# Patient Record
Sex: Female | Born: 1995 | Race: White | Hispanic: No | Marital: Married | Smoking: Never smoker
Health system: Southern US, Community
[De-identification: ages and names within clinical notes are randomized; demographics above are authoritative.]

---

## 2020-04-15 ENCOUNTER — Emergency Department (HOSPITAL_COMMUNITY)
Admission: EM | Admit: 2020-04-15 | Discharge: 2020-04-15 | Disposition: A | Discharge status: Home / Self Care | Payer: BC Managed Care – PPO | Attending: Emergency Medicine | Admitting: Emergency Medicine

## 2020-04-15 ENCOUNTER — Emergency Department (HOSPITAL_COMMUNITY): Payer: BC Managed Care – PPO

## 2020-04-15 ENCOUNTER — Other Ambulatory Visit: Payer: Self-pay

## 2020-04-15 ENCOUNTER — Encounter (HOSPITAL_COMMUNITY): Payer: Self-pay

## 2020-04-15 DIAGNOSIS — R109 Unspecified abdominal pain: Secondary | ICD-10-CM

## 2020-04-15 DIAGNOSIS — R112 Nausea with vomiting, unspecified: Secondary | ICD-10-CM | POA: Diagnosis not present

## 2020-04-15 DIAGNOSIS — R1030 Lower abdominal pain, unspecified: Secondary | ICD-10-CM | POA: Diagnosis not present

## 2020-04-15 LAB — COMPREHENSIVE METABOLIC PANEL
ALT: 15 U/L (ref 0–44)
AST: 18 U/L (ref 15–41)
Albumin: 4.4 g/dL (ref 3.5–5.0)
Alkaline Phosphatase: 45 U/L (ref 38–126)
Anion gap: 8 (ref 5–15)
BUN: 7 mg/dL (ref 6–20)
CO2: 27 mmol/L (ref 22–32)
Calcium: 9.7 mg/dL (ref 8.9–10.3)
Chloride: 106 mmol/L (ref 98–111)
Creatinine, Ser: 0.81 mg/dL (ref 0.44–1.00)
GFR, Estimated: >60 mL/min (ref >60)
Glucose, Bld: 95 mg/dL (ref 70–99)
Potassium: 3.7 mmol/L (ref 3.5–5.1)
Sodium: 141 mmol/L (ref 135–145)
Total Bilirubin: 0.6 mg/dL (ref 0.3–1.2)
Total Protein: 7.7 g/dL (ref 6.5–8.1)

## 2020-04-15 LAB — CBC WITH DIFFERENTIAL/PLATELET
Abs Immature Granulocytes: 0.01 10*3/uL (ref 0.00–0.07)
Basophils Absolute: 0 10*3/uL (ref 0.0–0.1)
Basophils Relative: 1 %
Eosinophils Absolute: 0.1 10*3/uL (ref 0.0–0.5)
Eosinophils Relative: 1 %
HCT: 42.2 % (ref 36.0–46.0)
Hemoglobin: 14.1 g/dL (ref 12.0–15.0)
Immature Granulocytes: 0 %
Lymphocytes Relative: 34 %
Lymphs Abs: 1.8 10*3/uL (ref 0.7–4.0)
MCH: 32.1 pg (ref 26.0–34.0)
MCHC: 33.4 g/dL (ref 30.0–36.0)
MCV: 96.1 fL (ref 80.0–100.0)
Monocytes Absolute: 0.5 10*3/uL (ref 0.1–1.0)
Monocytes Relative: 9 %
Neutro Abs: 2.9 10*3/uL (ref 1.7–7.7)
Neutrophils Relative %: 55 %
Platelets: 259 10*3/uL (ref 150–400)
RBC: 4.39 MIL/uL (ref 3.87–5.11)
RDW: 11.9 % (ref 11.5–15.5)
WBC: 5.3 10*3/uL (ref 4.0–10.5)
nRBC: 0 % (ref 0.0–0.2)

## 2020-04-15 LAB — URINALYSIS, ROUTINE W REFLEX MICROSCOPIC
Bilirubin Urine: NEGATIVE
Glucose, UA: NEGATIVE mg/dL
Hgb urine dipstick: NEGATIVE
Ketones, ur: NEGATIVE mg/dL
Nitrite: NEGATIVE
Protein, ur: NEGATIVE mg/dL
Specific Gravity, Urine: 1.012 (ref 1.005–1.030)
pH: 6 (ref 5.0–8.0)

## 2020-04-15 LAB — I-STAT BETA HCG BLOOD, ED (MC, WL, AP ONLY): I-stat hCG, quantitative: <5 m[IU]/mL (ref <5)

## 2020-04-15 LAB — LIPASE, BLOOD: Lipase: 35 U/L (ref 11–51)

## 2020-04-15 MED ORDER — HYDROCODONE-ACETAMINOPHEN 5-325 MG PO TABS: 1.0000 | ORAL_TABLET | ORAL | 0 refills | Status: AC | PRN

## 2020-04-15 MED ORDER — SODIUM CHLORIDE 0.9 % IV SOLN: INTRAVENOUS | Status: DC

## 2020-04-15 MED ORDER — METOCLOPRAMIDE HCL 5 MG/ML IJ SOLN
5.0000 mg | Freq: Once | INTRAMUSCULAR | Status: AC
  Administered 2020-04-15: 5 mg via INTRAVENOUS
  Filled 2020-04-15: qty 2

## 2020-04-15 MED ORDER — SODIUM CHLORIDE 0.9 % IV BOLUS
2000.0000 mL | Freq: Once | INTRAVENOUS | Status: AC
  Administered 2020-04-15: 2000 mL via INTRAVENOUS

## 2020-04-15 MED ORDER — MORPHINE SULFATE (PF) 4 MG/ML IV SOLN
4.0000 mg | Freq: Once | INTRAVENOUS | Status: AC
Start: 2020-04-15 — End: 2020-04-15
  Administered 2020-04-15: 4 mg via INTRAVENOUS
  Filled 2020-04-15: qty 1

## 2020-04-15 MED ORDER — IOHEXOL 300 MG/ML  SOLN
100.0000 mL | Freq: Once | INTRAMUSCULAR | Status: AC | PRN
  Administered 2020-04-15: 100 mL via INTRAVENOUS

## 2020-04-15 NOTE — ED Provider Notes (Signed)
[facility]-EMERGENCY DEPT Provider Note   CSN: [id number] Arrival date & time: 04/15/20  0531     History Chief Complaint  Patient presents with  . Abdominal Pain    Chau Savell is a 25 y.o. female.  25 year old female presents with lower abdominal discomfort x5 days.  Pain seems to wax and wanes and nothing makes it better.  Has had associated nausea and vomiting.  States that symptoms are worse when she eats.  Denies any fever or chills.  No urinary symptoms.  No vaginal bleeding or discharge.  States that she does not have regular menstrual cycles due to her birth control.  Has taken multiple home pregnancy tests that have been negative.  Denies any URI symptoms.  No prior history of same.        History reviewed. No pertinent past medical history.  There are no problems to display for this patient.   History reviewed. No pertinent surgical history.   OB History   No obstetric history on file.     No family history on file.  Social History   Tobacco Use  . Smoking status: Never Smoker  . Smokeless tobacco: Never Used  Substance Use Topics  . Alcohol use: Not Currently  . Drug use: Never    Home Medications Prior to Admission medications   Not on File    Allergies    Patient has no known allergies.  Review of Systems   Review of Systems  All other systems reviewed and are negative.   Physical Exam Updated Vital Signs BP 111/73 (BP Location: Left Arm)   Pulse 71   Temp 98.2 F (36.8 C) (Oral)   Resp 18   SpO2 100%   Physical Exam Vitals and nursing note reviewed.  Constitutional:      General: She is not in acute distress.    Appearance: Normal appearance. She is well-developed and well-nourished. She is not toxic-appearing.  HENT:     Head: Normocephalic and atraumatic.  Eyes:     General: Lids are normal.     Extraocular Movements: EOM normal.     Conjunctiva/sclera: Conjunctivae normal.     Pupils: Pupils are  equal, round, and reactive to light.  Neck:     Thyroid: No thyroid mass.     Trachea: No tracheal deviation.  Cardiovascular:     Rate and Rhythm: Normal rate and regular rhythm.     Heart sounds: Normal heart sounds. No murmur heard. No gallop.   Pulmonary:     Effort: Pulmonary effort is normal. No respiratory distress.     Breath sounds: Normal breath sounds. No stridor. No decreased breath sounds, wheezing, rhonchi or rales.  Abdominal:     General: Bowel sounds are normal. There is no distension.     Palpations: Abdomen is soft.     Tenderness: There is no abdominal tenderness. There is no CVA tenderness or rebound.    Musculoskeletal:        General: No tenderness or edema. Normal range of motion.     Cervical back: Normal range of motion and neck supple.  Skin:    General: Skin is warm and dry.     Findings: No abrasion or rash.  Neurological:     Mental Status: She is alert and oriented to person, place, and time.     GCS: GCS eye subscore is 4. GCS verbal subscore is 5. GCS motor subscore is 6.     Cranial Nerves:  No cranial nerve deficit.     Sensory: No sensory deficit.     Deep Tendon Reflexes: Strength normal.  Psychiatric:        Mood and Affect: Mood and affect normal.        Speech: Speech normal.        Behavior: Behavior normal.     ED Results / Procedures / Treatments   Labs (all labs ordered are listed, but only abnormal results are displayed) Labs Reviewed  CBC WITH DIFFERENTIAL/PLATELET  COMPREHENSIVE METABOLIC PANEL  LIPASE, BLOOD  URINALYSIS, ROUTINE W REFLEX MICROSCOPIC  I-STAT BETA HCG BLOOD, ED ([facility], [facility], [facility] ONLY)    EKG None  Radiology No results found.  Procedures Procedures (including critical care time)  Medications Ordered in ED Medications  0.9 %  sodium chloride infusion (has no administration in time range)  sodium chloride 0.9 % bolus 2,000 mL (has no administration in time range)  metoCLOPramide (REGLAN) injection 5 mg  (has no administration in time range)  morphine 4 MG/ML injection 4 mg (has no administration in time range)    ED Course  I have reviewed the triage vital signs and the nursing notes.  Pertinent labs & imaging results that were available during my care of the patient were reviewed by me and considered in my medical decision making (see chart for details).    MDM Rules/Calculators/A&P                          Treated with medications here and does feel better.  CT scan did not show any acute abnormalities.  Labs and urine are reassuring.  Will discharge home with return precautions Final Clinical Impression(s) / ED Diagnoses Final diagnoses:  None    Rx / DC Orders ED Discharge Orders    None       Lorre Nick, MD 04/15/20 1102

## 2020-04-15 NOTE — ED Triage Notes (Signed)
Pt reports lower abdominal pain, nausea and vomiting x 5 days.

## 2021-06-11 IMAGING — CT CT ABD-PELV W/ CM
2 of 4 series · 16 of 46 positions shown, 18 images · IV contrast (omnipaque)
Comparison: None.

CLINICAL DATA: Acute lower abdominal pain.

EXAM:
CT ABDOMEN AND PELVIS WITH CONTRAST
TECHNIQUE: Multidetector CT imaging of the abdomen and pelvis was performed
using the standard protocol following bolus administration of
intravenous contrast.
CONTRAST:  100mL OMNIPAQUE IOHEXOL 300 MG/ML  SOLN

[Series 2: axial st · axial · 0.78mm/px · z∈[+1206,+1576]mm · 13 of 84 slices shown, 15 images]
[im 5/84  soft-tissue]
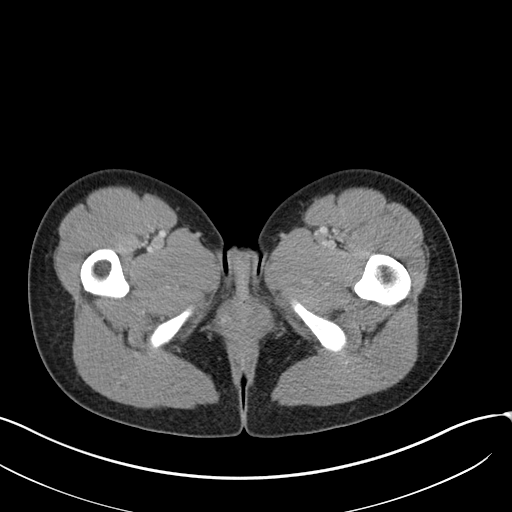
[im 5/84  bone]
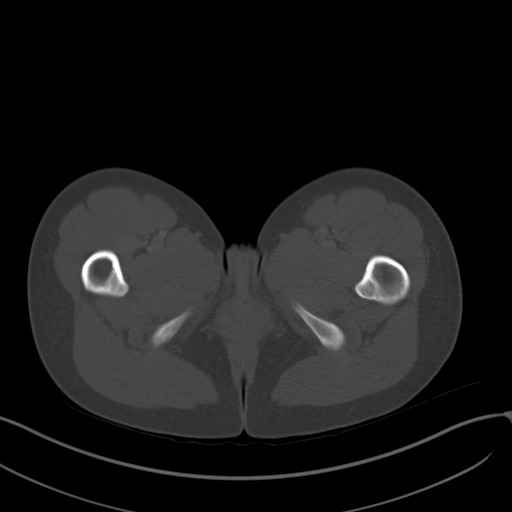
[im 10/84  soft-tissue]
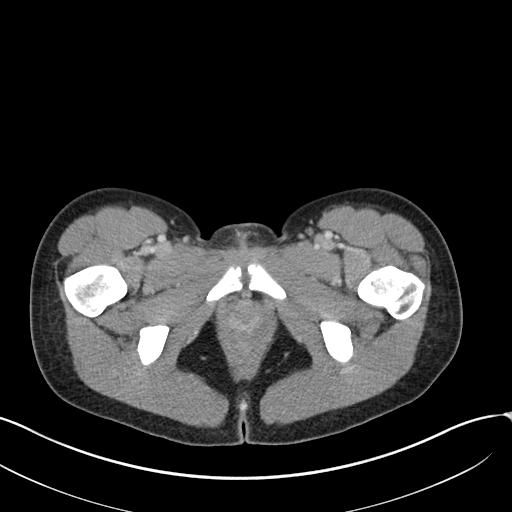
[im 19/84  soft-tissue]
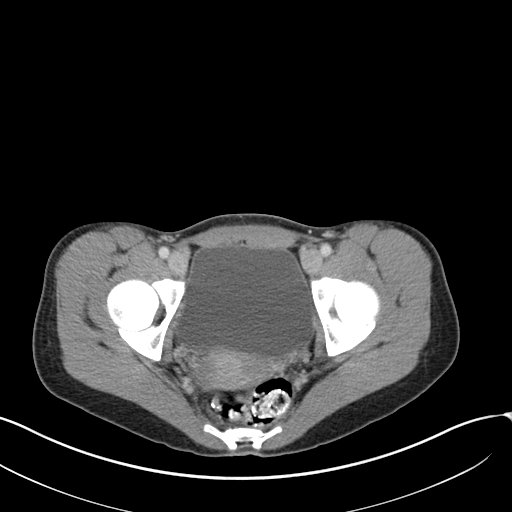
[im 24/84  soft-tissue]
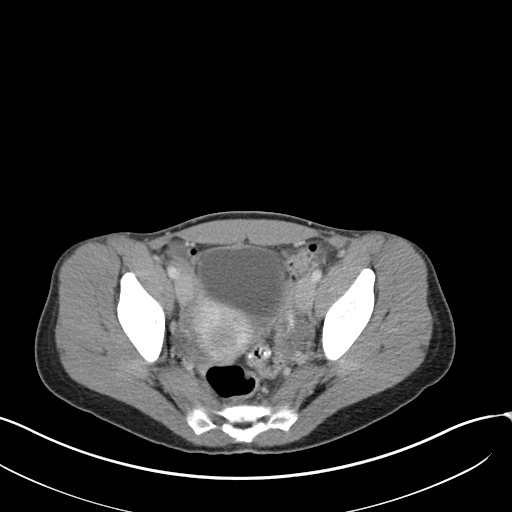
[im 28/84  soft-tissue]
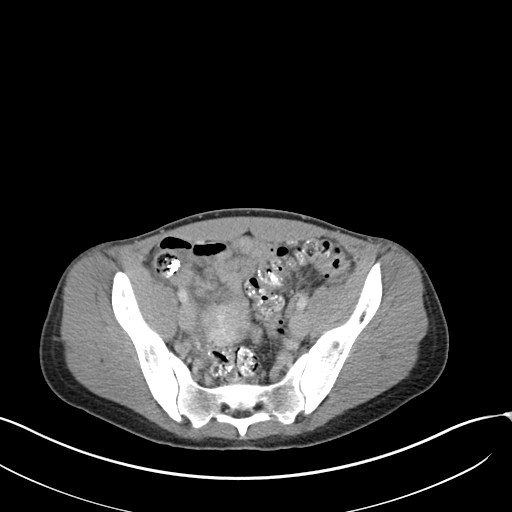
[im 37/84  soft-tissue]
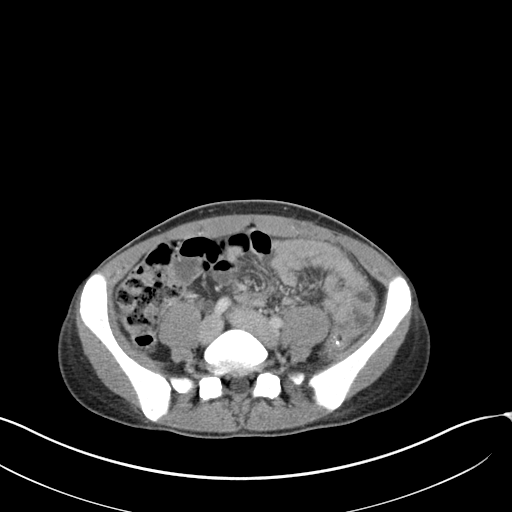
[im 42/84  soft-tissue]
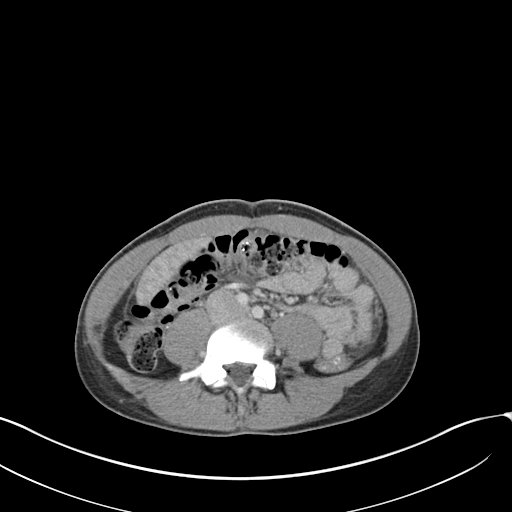
[im 47/84  soft-tissue]
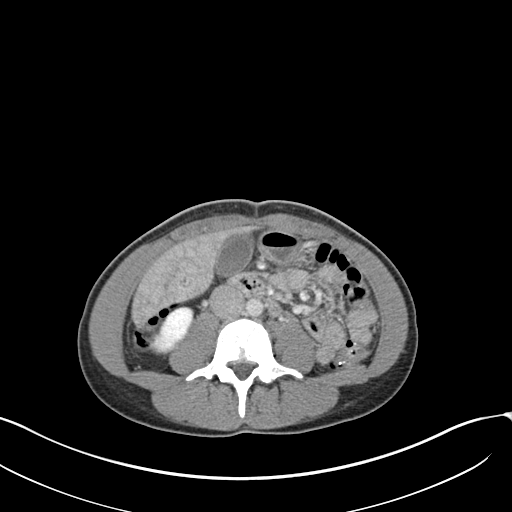
[im 56/84  soft-tissue]
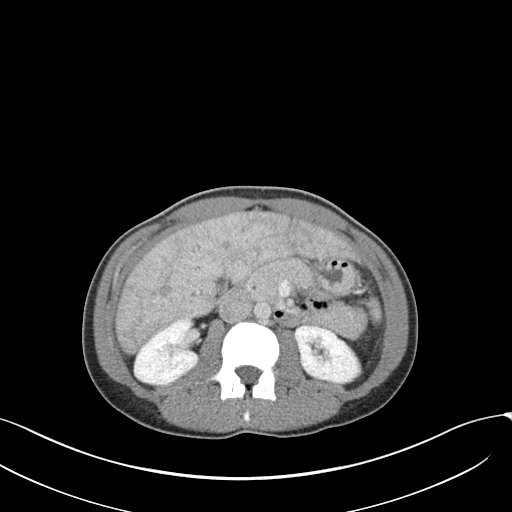
[im 56/84  bone]
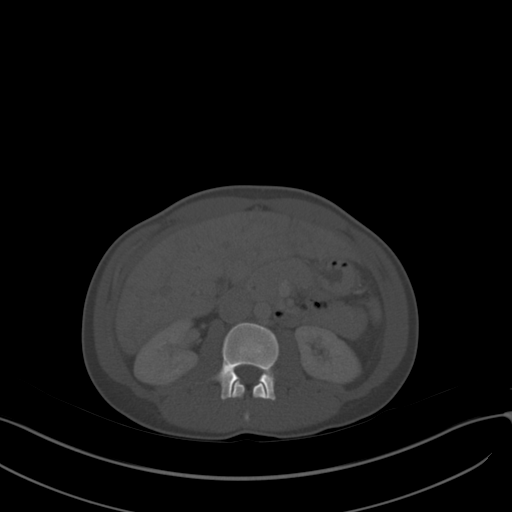
[im 60/84  soft-tissue]
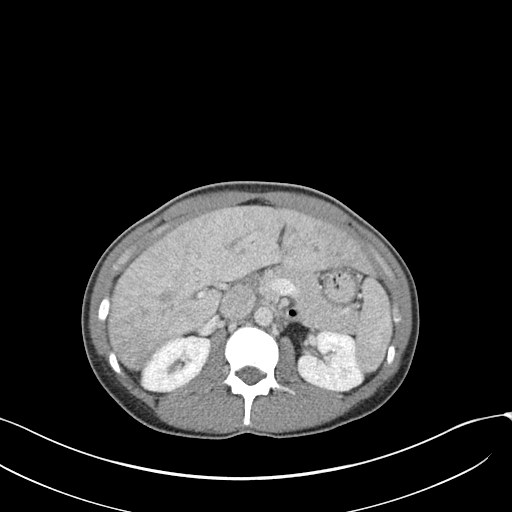
[im 65/84  soft-tissue]
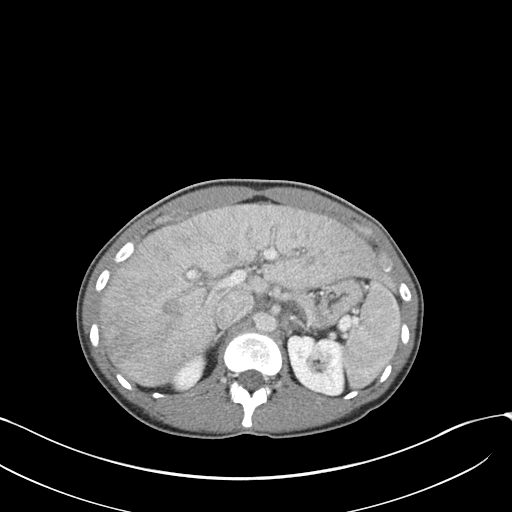
[im 74/84  soft-tissue]
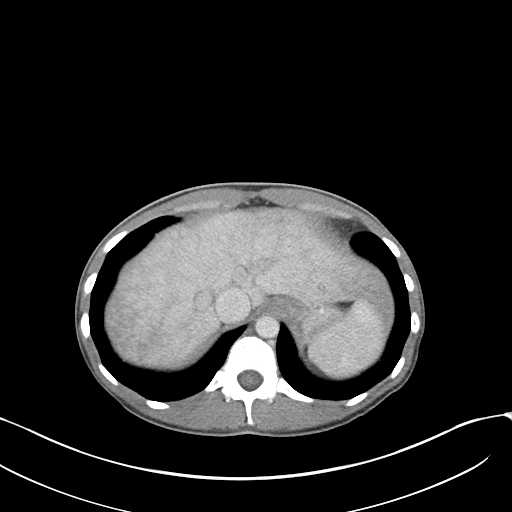
[im 79/84  soft-tissue]
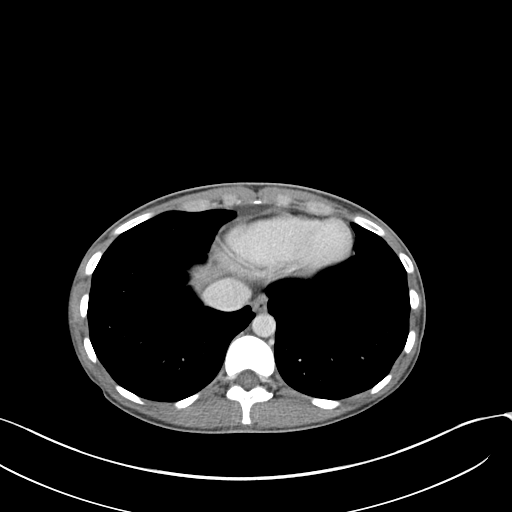

[Series 4: coronal st · coronal · 0.59mm/px · 3 of 107 slices shown]
[im 36/107  soft-tissue]
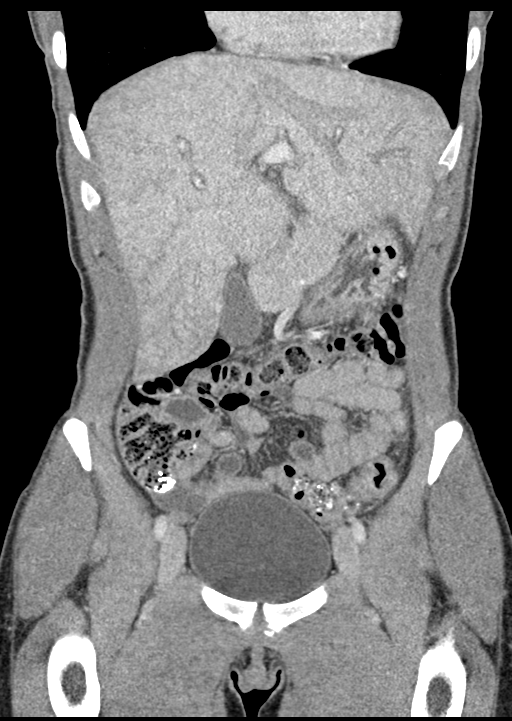
[im 48/107  soft-tissue]
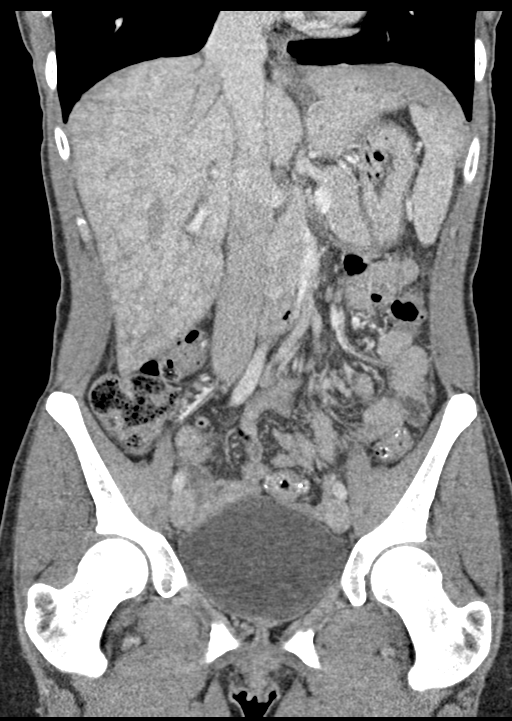
[im 59/107  soft-tissue]
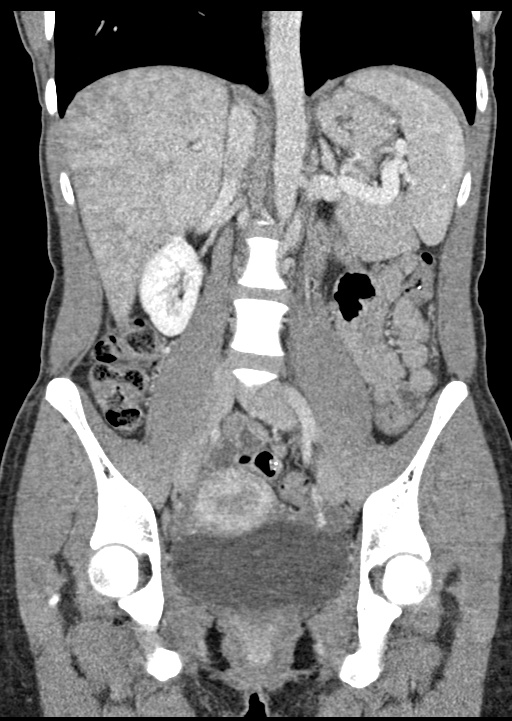

[16 of 46 positions shown; findings below may reference images not displayed]

FINDINGS: Lower chest: No acute abnormality.

Hepatobiliary: No focal liver abnormality is seen. No gallstones,
gallbladder wall thickening, or biliary dilatation.

Pancreas: Unremarkable. No pancreatic ductal dilatation or
surrounding inflammatory changes.

Spleen: Normal in size without focal abnormality.

Adrenals/Urinary Tract: Adrenal glands are unremarkable. Kidneys are
normal, without renal calculi, focal lesion, or hydronephrosis.
Bladder is unremarkable.

Stomach/Bowel: Stomach is within normal limits. Appendix appears
normal. No evidence of bowel wall thickening, distention, or
inflammatory changes.

Vascular/Lymphatic: No significant vascular findings are present. No
enlarged abdominal or pelvic lymph nodes.

Reproductive: Uterus and bilateral adnexa are unremarkable.

Other: No abdominal wall hernia or abnormality. No abdominopelvic
ascites.

Musculoskeletal: No acute or significant osseous findings.
IMPRESSION: No abnormality seen in the abdomen or pelvis.
